# Patient Record
Sex: Female | Born: 2000 | Race: Black or African American | Hispanic: No | Marital: Single | Smoking: Never smoker
Health system: Southern US, Community
[De-identification: ages and names within clinical notes are randomized; demographics above are authoritative.]

## PROBLEM LIST (undated history)

## (undated) DIAGNOSIS — O24419 Gestational diabetes mellitus in pregnancy, unspecified control: Secondary | ICD-10-CM

---
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
## 2021-02-21 ENCOUNTER — Encounter (HOSPITAL_BASED_OUTPATIENT_CLINIC_OR_DEPARTMENT_OTHER): Payer: Self-pay | Admitting: *Deleted

## 2021-02-21 ENCOUNTER — Emergency Department (HOSPITAL_BASED_OUTPATIENT_CLINIC_OR_DEPARTMENT_OTHER)
Admission: EM | Admit: 2021-02-21 | Discharge: 2021-02-21 | Disposition: A | Discharge status: Home / Self Care | Payer: Medicaid Other | Attending: Emergency Medicine | Admitting: Emergency Medicine

## 2021-02-21 ENCOUNTER — Other Ambulatory Visit: Payer: Self-pay

## 2021-02-21 DIAGNOSIS — Z20822 Contact with and (suspected) exposure to covid-19: Secondary | ICD-10-CM | POA: Diagnosis not present

## 2021-02-21 DIAGNOSIS — R509 Fever, unspecified: Secondary | ICD-10-CM | POA: Diagnosis present

## 2021-02-21 DIAGNOSIS — B349 Viral infection, unspecified: Secondary | ICD-10-CM | POA: Diagnosis not present

## 2021-02-21 LAB — RESP PANEL BY RT-PCR (FLU A&B, COVID) ARPGX2
Influenza A by PCR: NEGATIVE
Influenza B by PCR: NEGATIVE
SARS Coronavirus 2 by RT PCR: NEGATIVE

## 2021-02-21 MED ORDER — BENZONATATE 100 MG PO CAPS: 100.0000 mg | ORAL_CAPSULE | Freq: Three times a day (TID) | ORAL | 0 refills | Status: DC | PRN

## 2021-02-21 NOTE — ED Provider Notes (Signed)
[facility] EMERGENCY DEPARTMENT Provider Note   CSN: [id number] Arrival date & time: 02/21/21  1756     History Chief Complaint  Patient presents with   Fever    Jeanette Nixon is a 20 y.o. female.  The history is provided by the patient. No language interpreter was used.  Influenza Presenting symptoms: cough, fatigue, fever (subjective), headache, rhinorrhea and sore throat   Cough:    Cough characteristics:  Dry and non-productive   Severity:  Mild   Duration:  1 day   Timing:  Sporadic   Progression:  Unchanged   Chronicity:  New Fever:    Temp source:  Subjective   Progression:  Waxing and waning Sore throat:    Severity:  Mild   Duration:  1 day   Timing:  Constant   Progression:  Unchanged Severity:  Mild Duration:  1 day Progression:  Unchanged Chronicity:  New Relieved by:  Nothing Worsened by:  Nothing Associated symptoms: chills   Risk factors: sick contacts (suspects sick contacts at work)   Risk factors: no diabetes problem       History reviewed. No pertinent past medical history.  Patient Active Problem List   Diagnosis Date Noted   Complicated laceration of lip 11/26/2019    Past Surgical History:  Procedure Laterality Date   LID LESION EXCISION Left 11/25/2019   Procedure: Complex repair of Upper Lip laceration;  Surgeon: Osborn Coho, MD;  Location: [facility] OR;  Service: ENT;  Laterality: Left;     OB History   No obstetric history on file.     No family history on file.  Social History   Tobacco Use   Smoking status: Never   Smokeless tobacco: Never  Vaping Use   Vaping Use: Never used  Substance Use Topics   Alcohol use: Never   Drug use: Never    Home Medications Prior to Admission medications   Medication Sig Start Date End Date Taking? Authorizing Provider  benzonatate (TESSALON) 100 MG capsule Take 1 capsule (100 mg total) by mouth 3 (three) times daily as needed for cough. 02/21/21  Yes Antony Madura,  PA-C  amoxicillin-clavulanate (AUGMENTIN) 875-125 MG tablet Take 1 tablet by mouth every 12 (twelve) hours. 11/25/19   Melene Plan, DO  ibuprofen (ADVIL) 600 MG tablet Take 1 tablet (600 mg total) by mouth every 6 (six) hours as needed for up to 30 doses for mild pain or moderate pain. Take with food 10/08/19   Terald Sleeper, MD    Allergies    Patient has no known allergies.  Review of Systems   Review of Systems  Constitutional:  Positive for chills, fatigue and fever (subjective).  HENT:  Positive for rhinorrhea and sore throat.   Respiratory:  Positive for cough.   Neurological:  Positive for headaches.  Ten systems reviewed and are negative for acute change, except as noted in the HPI.    Physical Exam Updated Vital Signs BP (!) 152/94 (BP Location: Right Arm)   Pulse 99   Temp 98.3 F (36.8 C) (Oral)   Resp 18   Ht 5\' 3"  (1.6 m)   Wt 91.6 kg   LMP 02/09/2021   SpO2 100%   BMI 35.78 kg/m   Physical Exam Vitals and nursing note reviewed.  Constitutional:      General: She is not in acute distress.    Appearance: She is well-developed. She is not diaphoretic.     Comments: Nontoxic appearing  HENT:     Head: Normocephalic and atraumatic.     Right Ear: External ear normal.     Left Ear: External ear normal.     Nose: No congestion.  Eyes:     General: No scleral icterus.    Extraocular Movements: Extraocular movements intact.     Conjunctiva/sclera: Conjunctivae normal.  Cardiovascular:     Rate and Rhythm: Normal rate and regular rhythm.     Pulses: Normal pulses.  Pulmonary:     Effort: Pulmonary effort is normal. No respiratory distress.     Breath sounds: No stridor. No wheezing.     Comments: Lungs CTAB. Respirations even and unlabored. Musculoskeletal:        General: Normal range of motion.     Cervical back: Normal range of motion.  Skin:    General: Skin is warm and dry.     Coloration: Skin is not pale.     Findings: No erythema or rash.   Neurological:     Mental Status: She is alert and oriented to person, place, and time.     Coordination: Coordination normal.  Psychiatric:        Behavior: Behavior normal.    ED Results / Procedures / Treatments   Labs (all labs ordered are listed, but only abnormal results are displayed) Labs Reviewed  RESP PANEL BY RT-PCR (FLU A&B, COVID) [id number]    EKG None  Radiology No results found.  Procedures Procedures   Medications Ordered in ED Medications - No data to display  ED Course  I have reviewed the triage vital signs and the nursing notes.  Pertinent labs & imaging results that were available during my care of the patient were reviewed by me and considered in my medical decision making (see chart for details).    MDM Rules/Calculators/A&P                           Patient with symptoms consistent with viral illness.  Vitals are stable, no fever.  Lungs are clear, no hypoxia. Covid/Flu swab pending.  Patient will be discharged with instructions to orally hydrate, rest, and use over-the-counter medications such as anti-inflammatories ibuprofen and Aleve for muscle aches and Tylenol for fever.  Patient will also be given a cough suppressant. Return precautions discussed and provided. Patient discharged in stable condition with no unaddressed concerns.   Final Clinical Impression(s) / ED Diagnoses Final diagnoses:  Viral illness    Rx / DC Orders ED Discharge Orders          Ordered    benzonatate (TESSALON) 100 MG capsule  3 times daily PRN        02/21/21 1856             Antony Madura, PA-C 02/21/21 1900    Vanetta Mulders, MD 02/23/21 [phone]

## 2021-02-21 NOTE — Discharge Instructions (Addendum)
Your symptoms are consistent with a viral illness. Take tylenol or ibuprofen for fever, headaches, body aches. Use Tessalon as prescribed for cough. Drink plenty of fluids to prevent dehydration. You may continue to use other over-the-counter remedies for symptom control, if desired. Return for new or concerning symptoms such as worsening shortness of breath, coughing up blood, persistent vomiting, loss of consciousness.

## 2021-02-21 NOTE — ED Triage Notes (Signed)
C/o fever , chills, sore throat , h/a x 1 day

## 2021-04-12 ENCOUNTER — Other Ambulatory Visit: Payer: Self-pay

## 2021-04-12 ENCOUNTER — Emergency Department (HOSPITAL_BASED_OUTPATIENT_CLINIC_OR_DEPARTMENT_OTHER): Payer: Medicaid Other

## 2021-04-12 ENCOUNTER — Encounter (HOSPITAL_BASED_OUTPATIENT_CLINIC_OR_DEPARTMENT_OTHER): Payer: Self-pay | Admitting: *Deleted

## 2021-04-12 ENCOUNTER — Emergency Department (HOSPITAL_BASED_OUTPATIENT_CLINIC_OR_DEPARTMENT_OTHER)
Admission: EM | Admit: 2021-04-12 | Discharge: 2021-04-12 | Disposition: A | Discharge status: Home / Self Care | Payer: Medicaid Other | Attending: Emergency Medicine | Admitting: Emergency Medicine

## 2021-04-12 DIAGNOSIS — R791 Abnormal coagulation profile: Secondary | ICD-10-CM | POA: Insufficient documentation

## 2021-04-12 DIAGNOSIS — R072 Precordial pain: Secondary | ICD-10-CM | POA: Diagnosis not present

## 2021-04-12 DIAGNOSIS — R000 Tachycardia, unspecified: Secondary | ICD-10-CM | POA: Insufficient documentation

## 2021-04-12 DIAGNOSIS — R079 Chest pain, unspecified: Secondary | ICD-10-CM

## 2021-04-12 DIAGNOSIS — R0602 Shortness of breath: Secondary | ICD-10-CM | POA: Insufficient documentation

## 2021-04-12 DIAGNOSIS — D72829 Elevated white blood cell count, unspecified: Secondary | ICD-10-CM | POA: Insufficient documentation

## 2021-04-12 LAB — CBC WITH DIFFERENTIAL/PLATELET
Abs Immature Granulocytes: 0.05 10*3/uL (ref 0.00–0.07)
Basophils Absolute: 0 10*3/uL (ref 0.0–0.1)
Basophils Relative: 0 %
Eosinophils Absolute: 0.2 10*3/uL (ref 0.0–0.5)
Eosinophils Relative: 2 %
HCT: 35 % — ABNORMAL LOW (ref 36.0–46.0)
Hemoglobin: 11.4 g/dL — ABNORMAL LOW (ref 12.0–15.0)
Immature Granulocytes: 0 %
Lymphocytes Relative: 28 %
Lymphs Abs: 3.2 10*3/uL (ref 0.7–4.0)
MCH: 23.3 pg — ABNORMAL LOW (ref 26.0–34.0)
MCHC: 32.6 g/dL (ref 30.0–36.0)
MCV: 71.6 fL — ABNORMAL LOW (ref 80.0–100.0)
Monocytes Absolute: 0.6 10*3/uL (ref 0.1–1.0)
Monocytes Relative: 5 %
Neutro Abs: 7.5 10*3/uL (ref 1.7–7.7)
Neutrophils Relative %: 65 %
Platelets: 425 10*3/uL — ABNORMAL HIGH (ref 150–400)
RBC: 4.89 MIL/uL (ref 3.87–5.11)
RDW: 17.9 % — ABNORMAL HIGH (ref 11.5–15.5)
WBC: 11.5 10*3/uL — ABNORMAL HIGH (ref 4.0–10.5)
nRBC: 0 % (ref 0.0–0.2)

## 2021-04-12 LAB — BASIC METABOLIC PANEL
Anion gap: 7 (ref 5–15)
BUN: 6 mg/dL (ref 6–20)
CO2: 25 mmol/L (ref 22–32)
Calcium: 8.9 mg/dL (ref 8.9–10.3)
Chloride: 105 mmol/L (ref 98–111)
Creatinine, Ser: 0.7 mg/dL (ref 0.44–1.00)
GFR, Estimated: >60 mL/min (ref >60)
Glucose, Bld: 153 mg/dL — ABNORMAL HIGH (ref 70–99)
Potassium: 3.7 mmol/L (ref 3.5–5.1)
Sodium: 137 mmol/L (ref 135–145)

## 2021-04-12 LAB — TROPONIN I (HIGH SENSITIVITY): Troponin I (High Sensitivity): <2 ng/L (ref <18)

## 2021-04-12 LAB — D-DIMER, QUANTITATIVE: D-Dimer, Quant: 0.56 ug/mL-FEU — ABNORMAL HIGH (ref 0.00–0.50)

## 2021-04-12 LAB — PREGNANCY, URINE: Preg Test, Ur: NEGATIVE

## 2021-04-12 MED ORDER — ALUM & MAG HYDROXIDE-SIMETH 200-200-20 MG/5ML PO SUSP
30.0000 mL | Freq: Once | ORAL | Status: AC
  Administered 2021-04-12: 30 mL via ORAL
  Filled 2021-04-12: qty 30

## 2021-04-12 MED ORDER — LIDOCAINE VISCOUS HCL 2 % MT SOLN
15.0000 mL | Freq: Once | OROMUCOSAL | Status: AC
  Administered 2021-04-12: 15 mL via ORAL
  Filled 2021-04-12: qty 15

## 2021-04-12 MED ORDER — OMEPRAZOLE 20 MG PO CPDR: 20.0000 mg | DELAYED_RELEASE_CAPSULE | Freq: Every day | ORAL | 0 refills | Status: DC

## 2021-04-12 MED ORDER — IOHEXOL 350 MG/ML SOLN
100.0000 mL | Freq: Once | INTRAVENOUS | Status: AC | PRN
  Administered 2021-04-12: 100 mL via INTRAVENOUS

## 2021-04-12 NOTE — ED Provider Notes (Signed)
I received this patient in handoff from [facility].  Please see her chart for full history.  In short patient is a 21 year old female who presented with chest pain that started after she ate a greasy meal.  She is on OCPs and recently stopped vaping.  D-dimer was ordered and was elevated.  CT PE pending at time of shift change.  Physical Exam  BP 128/77    Pulse 97    Temp 98.3 F (36.8 C) (Oral)    Resp 18    Ht 5\' 3"  (1.6 m)    Wt 91.6 kg    LMP 03/12/2021    SpO2 99%    BMI 35.77 kg/m   Physical Exam Vitals and nursing note reviewed.  Constitutional:      Appearance: Normal appearance.  HENT:     Head: Normocephalic and atraumatic.  Eyes:     General: No scleral icterus.    Conjunctiva/sclera: Conjunctivae normal.  Pulmonary:     Effort: Pulmonary effort is normal. No respiratory distress.  Skin:    Findings: No rash.  Neurological:     Mental Status: She is alert.  Psychiatric:        Mood and Affect: Mood normal.    Procedures  Procedures  ED Course / MDM    CT PE negative.  Likely GERD related.  I will start the patient on omeprazole and have her follow-up with her primary care provider.  She is agreeable to this.     03/14/2021 04/12/21 2034    2035, MD 04/15/21 [phone]

## 2021-04-12 NOTE — ED Provider Notes (Addendum)
[facility] EMERGENCY DEPARTMENT Provider Note   CSN: [id number] Arrival date & time: 04/12/21  1523     History  Chief Complaint  Patient presents with   Chest Pain    Jeanette Nixon is a 21 y.o. female presents to the ED today with complaint of gradual onset, intermittent, substernal chest pain, sharp in nature that began earlier today around 2 to 2:15 PM.  Patient endorses that she went to work early at 1:45 PM at [employer].  She ate tenders and fries prior to going on the floor.  Shortly afterwards, approximately 50 minutes later she began having substernal chest pain and mild shortness of breath.  She denies any burning sensation in her chest or throat.  She does mention that she has had these issues intermittently -she states that the most recent episode prior to today was last week.  She states that she was seen in the ED a month ago for same however her work-up was negative at that time.  Per chart review she was seen in December for viral illness.  Her COVID and flu test were negative and she was discharged home.  There was no mention of chest pain during this visit however she does have a ED visit for chest pain in February.  She had a dimer collected at that time which was elevated however CTA negative for PE.  Patient is on OCPs at this time.  She does mention that she quit vaping about 2 to 3 weeks ago as well.  She denies any history of DVT or PE.  She denies any recent prolonged travel or immobilization.  No hemoptysis.  No active malignancy.   The history is provided by the patient and medical records.      Home Medications Prior to Admission medications   Medication Sig Start Date End Date Taking? Authorizing Provider  amoxicillin-clavulanate (AUGMENTIN) 875-125 MG tablet Take 1 tablet by mouth every 12 (twelve) hours. 11/25/19   Melene Plan, DO  benzonatate (TESSALON) 100 MG capsule Take 1 capsule (100 mg total) by mouth 3 (three) times daily as needed for  cough. 02/21/21   Antony Madura, PA-C  ibuprofen (ADVIL) 600 MG tablet Take 1 tablet (600 mg total) by mouth every 6 (six) hours as needed for up to 30 doses for mild pain or moderate pain. Take with food 10/08/19   Terald Sleeper, MD      Allergies    Patient has no known allergies.    Review of Systems   Review of Systems  Constitutional:  Negative for chills, diaphoresis and fever.  Respiratory:  Positive for shortness of breath. Negative for cough.   Cardiovascular:  Positive for chest pain.  Gastrointestinal:  Negative for nausea and vomiting.  All other systems reviewed and are negative.  Physical Exam Updated Vital Signs BP 128/77    Pulse 97    Temp 98.3 F (36.8 C) (Oral)    Resp 18    Ht 5\' 3"  (1.6 m)    Wt 91.6 kg    LMP 03/12/2021    SpO2 99%    BMI 35.77 kg/m  Physical Exam Vitals and nursing note reviewed.  Constitutional:      Appearance: She is obese. She is not ill-appearing or diaphoretic.  HENT:     Head: Normocephalic and atraumatic.  Eyes:     Conjunctiva/sclera: Conjunctivae normal.  Cardiovascular:     Rate and Rhythm: Normal rate and regular rhythm.  Heart sounds: Normal heart sounds.  Pulmonary:     Effort: Pulmonary effort is normal.     Breath sounds: Normal breath sounds. No decreased breath sounds, wheezing, rhonchi or rales.  Abdominal:     General: Bowel sounds are normal.     Palpations: Abdomen is soft.     Tenderness: There is no abdominal tenderness. There is no guarding or rebound.  Musculoskeletal:     Cervical back: Neck supple.  Skin:    General: Skin is warm and dry.  Neurological:     Mental Status: She is alert.    ED Results / Procedures / Treatments   Labs (all labs ordered are listed, but only abnormal results are displayed) Labs Reviewed  BASIC METABOLIC PANEL - Abnormal; Notable for the following components:      Result Value   Glucose, Bld 153 (*)    All other components within normal limits  CBC WITH  DIFFERENTIAL/PLATELET - Abnormal; Notable for the following components:   WBC 11.5 (*)    Hemoglobin 11.4 (*)    HCT 35.0 (*)    MCV 71.6 (*)    MCH 23.3 (*)    RDW 17.9 (*)    Platelets 425 (*)    All other components within normal limits  D-DIMER, QUANTITATIVE (NOT AT [facility]) - Abnormal; Notable for the following components:   D-Dimer, Quant 0.56 (*)    All other components within normal limits  PREGNANCY, URINE  TROPONIN I (HIGH SENSITIVITY)    EKG EKG Interpretation  Date/Time:  Friday April 12 2021 15:37:46 EST Ventricular Rate:  104 PR Interval:  136 QRS Duration: 74 QT Interval:  328 QTC Calculation: 431 R Axis:   66 Text Interpretation: Sinus tachycardia Otherwise normal ECG When compared with ECG of 11-May-2020 18:03, PREVIOUS ECG IS PRESENT Confirmed by Marianna Fuss ([id number]) on 04/12/2021 6:01:28 PM  Radiology DG Chest 2 View  Result Date: 04/12/2021 CLINICAL DATA:  Chest pain after eating EXAM: CHEST - 2 VIEW COMPARISON:  05/11/2020 FINDINGS: The heart size and mediastinal contours are within normal limits. Both lungs are clear. The visualized skeletal structures are unremarkable. IMPRESSION: No active cardiopulmonary disease. Electronically Signed   By: Jasmine Pang M.D.   On: 04/12/2021 17:57    Procedures Procedures    Medications Ordered in ED Medications  iohexol (OMNIPAQUE) 350 MG/ML injection 100 mL (has no administration in time range)  alum & mag hydroxide-simeth (MAALOX/MYLANTA) 200-200-20 MG/5ML suspension 30 mL (30 mLs Oral Given 04/12/21 1801)    And  lidocaine (XYLOCAINE) 2 % viscous mouth solution 15 mL (15 mLs Oral Given 04/12/21 1801)    ED Course/ Medical Decision Making/ A&P                           Medical Decision Making 21 year old female who presents to the ED today with complaint of substernal chest pain and shortness of breath began earlier today.  Started after eating chicken tenders and fries however patient admits that she has  had these issues intermittently.  She is on OCPs and only quit vaping about 2 to 3 weeks ago.  On arrival to the ED she is tachycardic in the 110s initially.  It does appear she had a PE work-up in February of last year which was negative however given obesity, OCPs, recent vaping she is at risk for same.  Her symptoms do sound more consistent with likely acid reflux however will work-up for  PE at this time.  We will provide GI cocktail and see if this does not help patient's symptoms as well.   Amount and/or Complexity of Data Reviewed Labs: ordered.    Details: CBC with mild leukocytosis 11.500. Hgb stable at 11.4 BMP with glucose 153. No other electrolyte abnormalities Troponin < 2 UPT  negative D dimer elevated at 0.56. WIll proceed with CTA at this time to rule out PE. Radiology: ordered.    Details: CXR clear ECG/medicine tests: ordered.    Details: EKG with sinus tachycardia at 104  Risk OTC drugs. Prescription drug management.   At shift change case [facility] out [facility], [place], who will dispo patient accordingly.         Final Clinical Impression(s) / ED Diagnoses Final diagnoses:  None    Rx / DC Orders ED Discharge Orders     None         Tanda RockersVenter, Adriane Guglielmo, PA-C 04/12/21 1941    Tanda RockersVenter, Patina Spanier, PA-C 04/12/21 1942    Milagros Lollykstra, Richard S, MD 04/15/21 [id number]

## 2021-04-12 NOTE — ED Notes (Signed)
PIV #20 gauge inserted to L AC.  Patient tolerated well.

## 2021-04-12 NOTE — Discharge Instructions (Addendum)
I believe your symptoms are likely due to some acid reflux.  I have sent a medication called omeprazole to the pharmacy to help treat this.  Please take it as prescribed.  Follow-up with your primary care provider if you continue to have symptoms such as you did today.

## 2021-04-12 NOTE — ED Notes (Signed)
Attempted IV start x 2.  Unsuccessful attempts.  Primary RN made aware

## 2021-04-12 NOTE — ED Notes (Signed)
Patient discharged to home.  All discharge instructions reviewed.  Patient verbalized understanding via teachback method.  VS WDL.  Respirations even and unlabored.  Ambulatory out of ED.   °

## 2021-04-12 NOTE — ED Triage Notes (Signed)
Chest pain after eating. Hx of same with normal EKG.

## 2021-06-05 ENCOUNTER — Encounter (HOSPITAL_BASED_OUTPATIENT_CLINIC_OR_DEPARTMENT_OTHER): Payer: Self-pay | Admitting: Urology

## 2021-06-05 ENCOUNTER — Emergency Department (HOSPITAL_BASED_OUTPATIENT_CLINIC_OR_DEPARTMENT_OTHER)
Admission: EM | Admit: 2021-06-05 | Discharge: 2021-06-06 | Disposition: A | Discharge status: Home / Self Care | Payer: Medicaid Other | Attending: Emergency Medicine | Admitting: Emergency Medicine

## 2021-06-05 ENCOUNTER — Other Ambulatory Visit: Payer: Self-pay

## 2021-06-05 DIAGNOSIS — R0981 Nasal congestion: Secondary | ICD-10-CM | POA: Diagnosis not present

## 2021-06-05 DIAGNOSIS — R059 Cough, unspecified: Secondary | ICD-10-CM | POA: Diagnosis not present

## 2021-06-05 DIAGNOSIS — J3489 Other specified disorders of nose and nasal sinuses: Secondary | ICD-10-CM | POA: Diagnosis not present

## 2021-06-05 DIAGNOSIS — J029 Acute pharyngitis, unspecified: Secondary | ICD-10-CM | POA: Insufficient documentation

## 2021-06-05 DIAGNOSIS — Z20822 Contact with and (suspected) exposure to covid-19: Secondary | ICD-10-CM | POA: Diagnosis not present

## 2021-06-05 DIAGNOSIS — R509 Fever, unspecified: Secondary | ICD-10-CM | POA: Diagnosis not present

## 2021-06-05 LAB — RESP PANEL BY RT-PCR (FLU A&B, COVID) ARPGX2
Influenza A by PCR: NEGATIVE
Influenza B by PCR: NEGATIVE
SARS Coronavirus 2 by RT PCR: NEGATIVE

## 2021-06-05 LAB — GROUP A STREP BY PCR: Group A Strep by PCR: NOT DETECTED

## 2021-06-05 MED ORDER — IBUPROFEN 800 MG PO TABS
800.0000 mg | ORAL_TABLET | Freq: Once | ORAL | Status: AC
Start: 2021-06-05 — End: 2021-06-05
  Administered 2021-06-05: 800 mg via ORAL
  Filled 2021-06-05: qty 1

## 2021-06-05 NOTE — ED Triage Notes (Signed)
Sore throat, sinus congestion that started yesterday  ?States Fever of 102.4 last night  ? ? ?

## 2021-06-06 ENCOUNTER — Encounter (HOSPITAL_BASED_OUTPATIENT_CLINIC_OR_DEPARTMENT_OTHER): Payer: Self-pay | Admitting: Emergency Medicine

## 2021-06-06 NOTE — ED Provider Notes (Signed)
?[facility] EMERGENCY DEPARTMENT ?Provider Note ? ? ?CSN: [id number] ?Arrival date & time: 06/05/21  2231 ? ?  ? ?History ? ?Chief Complaint  ?Patient presents with  ? flu like symptoms   ? ? ?Jeanette Nixon is a 21 y.o. female. ? ?The history is provided by the patient.  ?URI ?Presenting symptoms: congestion, cough, fever, rhinorrhea and sore throat   ?Severity:  Moderate ?Onset quality:  Gradual ?Duration:  1 day ?Timing:  Constant ?Progression:  Unchanged ?Chronicity:  New ?Relieved by:  Nothing ?Worsened by:  Nothing ?Ineffective treatments:  None tried ?Associated symptoms: no arthralgias and no headaches   ?Risk factors: sick contacts   ?Risk factors: not elderly, no chronic cardiac disease and no recent travel   ? ?  ? ?Home Medications ?Prior to Admission medications   ?Medication Sig Start Date End Date Taking? Authorizing Provider  ?amoxicillin-clavulanate (AUGMENTIN) 875-125 MG tablet Take 1 tablet by mouth every 12 (twelve) hours. 11/25/19   Melene Plan, DO  ?benzonatate (TESSALON) 100 MG capsule Take 1 capsule (100 mg total) by mouth 3 (three) times daily as needed for cough. 02/21/21   Antony Madura, PA-C  ?ibuprofen (ADVIL) 600 MG tablet Take 1 tablet (600 mg total) by mouth every 6 (six) hours as needed for up to 30 doses for mild pain or moderate pain. Take with food 10/08/19   Terald Sleeper, MD  ?omeprazole (PRILOSEC) 20 MG capsule Take 1 capsule (20 mg total) by mouth daily. 04/12/21   Redwine, Madison A, PA-C  ?   ? ?Allergies    ?Patient has no known allergies.   ? ?Review of Systems   ?Review of Systems  ?Constitutional:  Positive for fever.  ?HENT:  Positive for congestion, rhinorrhea and sore throat.   ?Eyes:  Negative for redness.  ?Respiratory:  Positive for cough.   ?Genitourinary:  Negative for difficulty urinating.  ?Musculoskeletal:  Negative for arthralgias.  ?Neurological:  Negative for headaches.  ?Psychiatric/Behavioral:  Negative for agitation.   ?All other systems  reviewed and are negative. ? ?Physical Exam ?Updated Vital Signs ?BP (!) 148/95 (BP Location: Left Arm)   Pulse (!) 119   Temp 98.3 ?F (36.8 ?C) (Oral)   Resp 18   Ht 5\' 3"  (1.6 m)   Wt 91.6 kg   LMP 05/18/2021 (Approximate)   SpO2 100%   BMI 35.77 kg/m?  ?Physical Exam ?Vitals and nursing note reviewed.  ?Constitutional:   ?   General: She is not in acute distress. ?   Appearance: Normal appearance.  ?HENT:  ?   Head: Normocephalic and atraumatic.  ?   Nose: Rhinorrhea present.  ?   Mouth/Throat:  ?   Mouth: Mucous membranes are moist.  ?   Pharynx: Oropharynx is clear. No oropharyngeal exudate.  ?Eyes:  ?   Conjunctiva/sclera: Conjunctivae normal.  ?   Pupils: Pupils are equal, round, and reactive to light.  ?Cardiovascular:  ?   Rate and Rhythm: Normal rate and regular rhythm.  ?   Pulses: Normal pulses.  ?   Heart sounds: Normal heart sounds.  ?Pulmonary:  ?   Effort: Pulmonary effort is normal.  ?   Breath sounds: Normal breath sounds.  ?Abdominal:  ?   General: Abdomen is flat. Bowel sounds are normal.  ?   Palpations: Abdomen is soft.  ?   Tenderness: There is no abdominal tenderness. There is no guarding.  ?Musculoskeletal:     ?   General: Normal range of  motion.  ?   Cervical back: Normal range of motion and neck supple.  ?Skin: ?   General: Skin is warm and dry.  ?   Capillary Refill: Capillary refill takes less than 2 seconds.  ?Neurological:  ?   General: No focal deficit present.  ?   Mental Status: She is alert and oriented to person, place, and time.  ?   Deep Tendon Reflexes: Reflexes normal.  ?Psychiatric:     ?   Mood and Affect: Mood normal.     ?   Behavior: Behavior normal.  ? ? ?ED Results / Procedures / Treatments   ?Labs ?(all labs ordered are listed, but only abnormal results are displayed) ?Labs Reviewed  ?RESP PANEL BY RT-PCR (FLU A&B, COVID) [id number]  ?GROUP A STREP BY PCR  ? ? ?EKG ?None ? ?Radiology ?No results found. ? ?Procedures ?Procedures  ? ? ?Medications Ordered in  ED ?Medications  ?ibuprofen (ADVIL) tablet 800 mg (800 mg Oral Given 06/05/21 2329)  ? ? ?ED Course/ Medical Decision Making/ A&P ?  ?                        ?Medical Decision Making ?URI symptoms x 1 day  ? ?Amount and/or Complexity of Data Reviewed ?External Data Reviewed: notes. ?   Details: previous ED visits ?Labs: ordered. ?   Details: strep, covid and flu are all negative as reviewed by me. ? ?Risk ?OTC drugs. ?Risk Details: Patient is very well appearing with URI symptoms x 1 day.  No LAN.  No fluid in the sinuses on transillumination.  Patient is stable for discharge with close follow up.  Alternate tylenol and ibuprofen.   ? ? ?Final Clinical Impression(s) / ED Diagnoses ?Final diagnoses:  ?None  ?Return for intractable cough, coughing up blood, fevers > 100.4 unrelieved by medication, shortness of breath, intractable vomiting, chest pain, shortness of breath, weakness, numbness, changes in speech, facial asymmetry, abdominal pain, passing out, Inability to tolerate liquids or food, cough, altered mental status or any concerns. No signs of systemic illness or infection. The patient is nontoxic-appearing on exam and vital signs are within normal limits.  ?I have reviewed the triage vital signs and the nursing notes. Pertinent labs & imaging results that were available during my care of the patient were reviewed by me and considered in my medical decision making (see chart for details). After history, exam, and medical workup I feel the patient has been appropriately medically screened and is safe for discharge home. Pertinent diagnoses were discussed with the patient. Patient was given return precautions.  ? ?Rx / DC Orders ?ED Discharge Orders   ? ? None  ? ?  ? ? ?  ?Reesha Debes, MD ?06/06/21 0020 ? ?

## 2021-08-13 ENCOUNTER — Ambulatory Visit
Admission: EM | Admit: 2021-08-13 | Discharge: 2021-08-13 | Disposition: A | Discharge status: Home / Self Care | Payer: Medicaid Other | Attending: Urgent Care | Admitting: Urgent Care

## 2021-08-13 DIAGNOSIS — Z3201 Encounter for pregnancy test, result positive: Secondary | ICD-10-CM

## 2021-08-13 LAB — POCT URINE PREGNANCY: Preg Test, Ur: POSITIVE — AB

## 2021-08-13 NOTE — ED Triage Notes (Signed)
Pt states she had a positive at home pregnancy test and is wanting to be tested again here. LMP: 07/15/21

## 2021-08-13 NOTE — ED Provider Notes (Signed)
[facility] - URGENT [facility]ARE [facility]   MRN: [id number] DOB: 05/27/2000  Subjective:   Jeanette Nixon is a 21 y.o. female presenting for confirmation pregnancy test as required by her OB/GYN.  LMP was 07/15/2021.  This would not be her first pregnancy.  She had a home pregnancy test that was positive. Denies fever, n/v, abdominal pain, pelvic pain, rashes, dysuria, urinary frequency, hematuria, vaginal discharge.    No current facility-administered medications for this encounter.  Current Outpatient Medications:    amoxicillin-clavulanate (AUGMENTIN) 875-125 MG tablet, Take 1 tablet by mouth every 12 (twelve) hours., Disp: 14 tablet, Rfl: 0   benzonatate (TESSALON) 100 MG capsule, Take 1 capsule (100 mg total) by mouth 3 (three) times daily as needed for cough., Disp: 21 capsule, Rfl: 0   ibuprofen (ADVIL) 600 MG tablet, Take 1 tablet (600 mg total) by mouth every 6 (six) hours as needed for up to 30 doses for mild pain or moderate pain. Take with food, Disp: 30 tablet, Rfl: 0   omeprazole (PRILOSEC) 20 MG capsule, Take 1 capsule (20 mg total) by mouth daily., Disp: 30 capsule, Rfl: 0   No Known Allergies  History reviewed. No pertinent past medical history.   Past Surgical History:  Procedure Laterality Date   LID LESION EXCISION Left 11/25/2019   Procedure: Complex repair of Upper Lip laceration;  Surgeon: Osborn Coho, MD;  Location: [facility] OR;  Service: ENT;  Laterality: Left;    History reviewed. No pertinent family history.  Social History   Tobacco Use   Smoking status: Never   Smokeless tobacco: Never  Vaping Use   Vaping Use: Never used  Substance Use Topics   Alcohol use: Yes    Comment: occ   Drug use: Not Currently    Types: Marijuana    Comment: occ    ROS   Objective:   Vitals: BP 122/80 (BP Location: Right Arm)   Pulse (!) 110   Temp 98.8 F (37.1 C) (Oral)   Resp 18   LMP 07/15/2021   SpO2 97%   Physical Exam Constitutional:      General:  She is not in acute distress.    Appearance: Normal appearance. She is well-developed. She is not ill-appearing, toxic-appearing or diaphoretic.  HENT:     Head: Normocephalic and atraumatic.     Nose: Nose normal.     Mouth/Throat:     Mouth: Mucous membranes are moist.  Eyes:     General: No scleral icterus.       Right eye: No discharge.        Left eye: No discharge.     Extraocular Movements: Extraocular movements intact.  Cardiovascular:     Rate and Rhythm: Normal rate.  Pulmonary:     Effort: Pulmonary effort is normal.  Skin:    General: Skin is warm and dry.  Neurological:     General: No focal deficit present.     Mental Status: She is alert and oriented to person, place, and time.  Psychiatric:        Mood and Affect: Mood normal.        Behavior: Behavior normal.    Results for orders placed or performed during the hospital encounter of 08/13/21 (from the past 24 hour(s))  POCT urine pregnancy     Status: Abnormal   Collection Time: 08/13/21  1:29 PM  Result Value Ref Range   Preg Test, Ur Positive (A) Negative    Assessment and Plan :  PDMP not reviewed this encounter.  1. Positive pregnancy test    Anticipatory guidance provided.  I did give her her letter of confirmation of pregnancy test. Counseled patient on potential for adverse effects with medications prescribed/recommended today, ER and return-to-clinic precautions discussed, patient verbalized understanding.    Wallis Bamberg, PA-C 08/13/21 1410

## 2021-08-19 ENCOUNTER — Emergency Department (HOSPITAL_BASED_OUTPATIENT_CLINIC_OR_DEPARTMENT_OTHER)
Admission: EM | Admit: 2021-08-19 | Discharge: 2021-08-20 | Disposition: A | Discharge status: Home / Self Care | Payer: Medicaid Other | Attending: Emergency Medicine | Admitting: Emergency Medicine

## 2021-08-19 ENCOUNTER — Encounter (HOSPITAL_BASED_OUTPATIENT_CLINIC_OR_DEPARTMENT_OTHER): Payer: Self-pay | Admitting: Emergency Medicine

## 2021-08-19 ENCOUNTER — Other Ambulatory Visit: Payer: Self-pay

## 2021-08-19 DIAGNOSIS — Z3A01 Less than 8 weeks gestation of pregnancy: Secondary | ICD-10-CM | POA: Diagnosis not present

## 2021-08-19 DIAGNOSIS — O4690 Antepartum hemorrhage, unspecified, unspecified trimester: Secondary | ICD-10-CM

## 2021-08-19 DIAGNOSIS — N9489 Other specified conditions associated with female genital organs and menstrual cycle: Secondary | ICD-10-CM | POA: Diagnosis not present

## 2021-08-19 DIAGNOSIS — O209 Hemorrhage in early pregnancy, unspecified: Secondary | ICD-10-CM | POA: Insufficient documentation

## 2021-08-19 NOTE — ED Provider Notes (Signed)
Emergency Department Provider Note   I have reviewed the triage vital signs and the nursing notes.   HISTORY  Chief Complaint Vaginal Bleeding   HPI Jeanette Nixon is a 21 y.o. female G2P1 currently approx [redacted] weeks pregnant presents to the ED with cramping abdominal pain and vaginal bleeding. Patient developed vaginal bleeding at 7 AM which stopped but then returned 1 hour PTA. No passage of clots or tissue. No abdominal or back pain. No weakness, SOB, or lightheadedness.   History reviewed. No pertinent past medical history.  Review of Systems Constitutional: No fever/chills Eyes: No visual changes. ENT: No sore throat. Cardiovascular: Denies chest pain. Respiratory: Denies shortness of breath. Gastrointestinal: No abdominal pain.  No nausea, no vomiting.  No diarrhea.  No constipation. Genitourinary: Negative for dysuria. Positive vaginal bleeding.  Musculoskeletal: Negative for back pain. Skin: Negative for rash. Neurological: Negative for headaches, focal weakness or numbness.   ____________________________________________   PHYSICAL EXAM:  VITAL SIGNS: ED Triage Vitals  Enc Vitals Group     BP 08/19/21 2215 (!) 143/92     Pulse Rate 08/19/21 2215 (!) 105     Resp 08/19/21 2215 16     Temp 08/19/21 2215 98.3 F (36.8 C)     Temp src --      SpO2 08/19/21 2215 99 %     Weight 08/19/21 2214 198 lb (89.8 kg)     Height 08/19/21 2214 5\' 4"  (1.626 m)   Constitutional: Alert and oriented. Well appearing and in no acute distress. Eyes: Conjunctivae are normal. Head: Atraumatic. Nose: No congestion/rhinnorhea. Mouth/Throat: Mucous membranes are moist.   Neck: No stridor.   Cardiovascular: Normal rate, regular rhythm. Good peripheral circulation. Grossly normal heart sounds.   Respiratory: Normal respiratory effort.  No retractions. Lungs CTAB. Gastrointestinal: Soft and nontender. No distention.  Musculoskeletal: No gross deformities of  extremities. Neurologic:  Normal speech and language.  Skin:  Skin is warm, dry and intact. No rash noted.   ____________________________________________   LABS (all labs ordered are listed, but only abnormal results are displayed)  Labs Reviewed  COMPREHENSIVE METABOLIC PANEL - Abnormal; Notable for the following components:      Result Value   Albumin 3.4 (*)    All other components within normal limits  CBC WITH DIFFERENTIAL/PLATELET - Abnormal; Notable for the following components:   WBC 13.8 (*)    Hemoglobin 10.3 (*)    HCT 31.0 (*)    MCV 72.4 (*)    MCH 24.1 (*)    RDW 18.9 (*)    Platelets 447 (*)    Neutro Abs 9.5 (*)    All other components within normal limits  HCG, QUANTITATIVE, PREGNANCY - Abnormal; Notable for the following components:   hCG, Beta Chain, Quant, S 7,428 (*)    All other components within normal limits  WET PREP, GENITAL  URINALYSIS, ROUTINE W REFLEX MICROSCOPIC  ABO/RH  GC/CHLAMYDIA PROBE AMP ([place]) NOT AT [facility]   ____________________________________________  RADIOLOGY  [facility]TO [facility] OB LESS THAN 14 WEEKS WITH OB TRANSVAGINAL  Result Date: 08/20/2021 CLINICAL DATA:  Pregnant, bleeding EXAM: OBSTETRIC <14 WK 08/22/2021 AND TRANSVAGINAL OB US TECHNIQUE: Both transabdominal and transvaginal ultrasound examinations were performed for complete evaluation of the gestation as well as the maternal uterus, adnexal regions, and pelvic cul-de-sac. Transvaginal technique was performed to assess early pregnancy. COMPARISON:  None Available. FINDINGS: Intrauterine gestational sac: Single Yolk sac:  Visualized. Embryo:  Visualized. Cardiac Activity: Not Visualized. CRL:  2 mm   5 w   5 d                  [place] EDC: 04/17/2012 Subchorionic hemorrhage:  None visualized. Maternal uterus/adnexae: Bilateral ovaries are within normal limits, noting right corpus luteum. Trace free fluid. IMPRESSION: Single intrauterine gestation, measuring 5 weeks 5 days by crown-rump length. No  cardiac activity, possibly due to the small size of the early gestation. Consider follow-up pelvic ultrasound in 7-10 days to confirm viability, as clinically warranted. Electronically Signed   By: Charline Bills M.D.   On: 08/20/2021 01:54    ____________________________________________   PROCEDURES  Procedure(s) performed:   Procedures  None  ____________________________________________   INITIAL IMPRESSION / ASSESSMENT AND PLAN / ED COURSE  Pertinent labs & imaging results that were available during my care of the patient were reviewed by me and considered in my medical decision making (see chart for details).   This patient is Presenting for Evaluation of vaginal bleeding in pregnancy, which does require a range of treatment options, and is a complaint that involves a high risk of morbidity and mortality.  The Differential Diagnoses include placental abruption, miscarriage, UTI, vaginitis.    I decided to review pertinent External Data, and in summary No ABO RH status in chart review.   Clinical Laboratory Tests Ordered, included quant hCG is > 7000. UA without evidence of infection. No AKI. No severe anemia. Rh Positive.   Radiologic Tests Ordered, included pelvic US. I independently interpreted the images and agree with radiology interpretation.   Cardiac Monitor Tracing which shows NSR.   Social Determinants of Health Risk patient is a non-smoker.    Medical Decision Making: Summary:  Patient presents to the emergency department with bleeding in early pregnancy.  She is approximately [redacted] weeks pregnant with bleeding today.  Nontender abdomen.  Reevaluation with update and discussion with patient. Discussed [place] results and OB follow up plan for repeat pelvic US. Discussed strict ED return precautions.   Disposition: discharge  ____________________________________________  FINAL CLINICAL IMPRESSION(S) / ED DIAGNOSES  Final diagnoses:  Vaginal bleeding in  pregnancy    Note:  This document was prepared using Dragon voice recognition software and may include unintentional dictation errors.  Alona Bene, MD, [facility] Emergency Medicine    Bret Stamour, Arlyss Repress, MD 08/20/21 [phone]

## 2021-08-19 NOTE — ED Triage Notes (Signed)
Patient reports she recently found out she was pregnant and is approx. 5 weeks and 3 days. Patient c/o vaginal bleeding since this morning around 7 am, stopped, and started back about 1 hour pta and abdominal cramping.

## 2021-08-20 ENCOUNTER — Emergency Department (HOSPITAL_BASED_OUTPATIENT_CLINIC_OR_DEPARTMENT_OTHER): Payer: Medicaid Other

## 2021-08-20 LAB — COMPREHENSIVE METABOLIC PANEL
ALT: 16 U/L (ref 0–44)
AST: 18 U/L (ref 15–41)
Albumin: 3.4 g/dL — ABNORMAL LOW (ref 3.5–5.0)
Alkaline Phosphatase: 60 U/L (ref 38–126)
Anion gap: 7 (ref 5–15)
BUN: 10 mg/dL (ref 6–20)
CO2: 24 mmol/L (ref 22–32)
Calcium: 9.3 mg/dL (ref 8.9–10.3)
Chloride: 106 mmol/L (ref 98–111)
Creatinine, Ser: 0.65 mg/dL (ref 0.44–1.00)
GFR, Estimated: >60 mL/min (ref >60)
Glucose, Bld: 97 mg/dL (ref 70–99)
Potassium: 3.5 mmol/L (ref 3.5–5.1)
Sodium: 137 mmol/L (ref 135–145)
Total Bilirubin: 0.4 mg/dL (ref 0.3–1.2)
Total Protein: 7.7 g/dL (ref 6.5–8.1)

## 2021-08-20 LAB — CBC WITH DIFFERENTIAL/PLATELET
Abs Immature Granulocytes: 0.06 10*3/uL (ref 0.00–0.07)
Basophils Absolute: 0 10*3/uL (ref 0.0–0.1)
Basophils Relative: 0 %
Eosinophils Absolute: 0.1 10*3/uL (ref 0.0–0.5)
Eosinophils Relative: 1 %
HCT: 31 % — ABNORMAL LOW (ref 36.0–46.0)
Hemoglobin: 10.3 g/dL — ABNORMAL LOW (ref 12.0–15.0)
Immature Granulocytes: 0 %
Lymphocytes Relative: 24 %
Lymphs Abs: 3.3 10*3/uL (ref 0.7–4.0)
MCH: 24.1 pg — ABNORMAL LOW (ref 26.0–34.0)
MCHC: 33.2 g/dL (ref 30.0–36.0)
MCV: 72.4 fL — ABNORMAL LOW (ref 80.0–100.0)
Monocytes Absolute: 0.7 10*3/uL (ref 0.1–1.0)
Monocytes Relative: 5 %
Neutro Abs: 9.5 10*3/uL — ABNORMAL HIGH (ref 1.7–7.7)
Neutrophils Relative %: 70 %
Platelets: 447 10*3/uL — ABNORMAL HIGH (ref 150–400)
RBC: 4.28 MIL/uL (ref 3.87–5.11)
RDW: 18.9 % — ABNORMAL HIGH (ref 11.5–15.5)
WBC: 13.8 10*3/uL — ABNORMAL HIGH (ref 4.0–10.5)
nRBC: 0 % (ref 0.0–0.2)

## 2021-08-20 LAB — HCG, QUANTITATIVE, PREGNANCY: hCG, Beta Chain, Quant, S: 7428 m[IU]/mL — ABNORMAL HIGH (ref <5)

## 2021-08-20 LAB — URINALYSIS, ROUTINE W REFLEX MICROSCOPIC
Bilirubin Urine: NEGATIVE
Glucose, UA: NEGATIVE mg/dL
Hgb urine dipstick: NEGATIVE
Ketones, ur: NEGATIVE mg/dL
Leukocytes,Ua: NEGATIVE
Nitrite: NEGATIVE
Protein, ur: NEGATIVE mg/dL
Specific Gravity, Urine: 1.025 (ref 1.005–1.030)
pH: 6 (ref 5.0–8.0)

## 2021-08-20 LAB — ABO/RH: ABO/RH(D): AB POS

## 2021-08-20 NOTE — Discharge Instructions (Signed)
You were seen in the emergency room today with bleeding during pregnancy.  Your ultrasound today shows an intrauterine pregnancy at approximately 5 weeks / 5 days along.  This pregnancy is still very early.  The radiologist is recommending a repeat ultrasound in 7 to 10 days.  Please follow with your OB/GYN to schedule this.  If you develop new or suddenly worsening symptoms such as worsening abdominal pain or bleeding you should return to the emergency department for reevaluation.

## 2021-08-28 ENCOUNTER — Ambulatory Visit: Payer: Medicaid Other | Admitting: Nurse Practitioner

## 2022-03-05 ENCOUNTER — Emergency Department (HOSPITAL_BASED_OUTPATIENT_CLINIC_OR_DEPARTMENT_OTHER)
Admission: EM | Admit: 2022-03-05 | Discharge: 2022-03-06 | Disposition: A | Discharge status: Home / Self Care | Payer: Medicaid Other | Attending: Emergency Medicine | Admitting: Emergency Medicine

## 2022-03-05 ENCOUNTER — Other Ambulatory Visit: Payer: Self-pay

## 2022-03-05 DIAGNOSIS — Z3A34 34 weeks gestation of pregnancy: Secondary | ICD-10-CM | POA: Insufficient documentation

## 2022-03-05 DIAGNOSIS — O368130 Decreased fetal movements, third trimester, not applicable or unspecified: Secondary | ICD-10-CM | POA: Diagnosis present

## 2022-03-05 HISTORY — DX: Gestational diabetes mellitus in pregnancy, unspecified control: O24.419

## 2022-03-05 NOTE — ED Triage Notes (Signed)
Pt POV -G1P00  Reports "less" fetal movement than normal.  Last felt baby move appx 2000 today.   Denies pain. Denies leaking/loss fluid. Denies falls or injuries.   [facility].  EDD 04/17/2022

## 2022-03-05 NOTE — ED Triage Notes (Incomplete)
Pt POV -  Reports "less" fetal movement than normal.  Last felt baby move appx 2000 today. Reports [redacted]wk pregnant.   Denies pain. Denies leaking/loss fluid. Denies falls or injuries.   [facility] [address] at [facility].  EDD

## 2022-03-06 ENCOUNTER — Other Ambulatory Visit: Payer: Self-pay

## 2022-03-06 ENCOUNTER — Encounter (HOSPITAL_BASED_OUTPATIENT_CLINIC_OR_DEPARTMENT_OTHER): Payer: Self-pay | Admitting: Emergency Medicine

## 2022-03-06 NOTE — Progress Notes (Signed)
Spoke with Lyla Son at [facility] and advised of Dr Alyssa Grove recommendation to discharge patient home.

## 2022-03-06 NOTE — ED Provider Notes (Signed)
[facility] EMERGENCY DEPARTMENT Provider Note   CSN: [id number] Arrival date & time: 03/05/22  2351     History  Chief Complaint  Patient presents with   Decreased Fetal Movement    Jeanette Nixon is a 21 y.o. female.  The history is provided by the patient.  Patient is a G1, P0 at approximately 34 weeks who presents with decreased fetal movement.  Patient reports that she has had decreased fetal movement since 8 PM.  No fevers or vomiting.  No abdominal pain.  No vaginal bleeding or loss of fluid.  No recent falls or traumas.  She has otherwise been doing well.  She does report gestational diabetes but is well-controlled     Home Medications Prior to Admission medications   Not on File      Allergies    Patient has no known allergies.    Review of Systems   Review of Systems  Constitutional:  Negative for fever.  Gastrointestinal:  Negative for vomiting.  Genitourinary:  Negative for vaginal bleeding and vaginal discharge.    Physical Exam Updated Vital Signs BP 132/84   Pulse (!) 102   Temp 98.6 F (37 C) (Oral)   Resp 19   Ht 1.626 m (5\' 4" )   Wt 101.6 kg   LMP 07/15/2021 (Exact Date)   SpO2 97%   BMI 38.45 kg/m  Physical Exam CONSTITUTIONAL: Well developed/well nourished HEAD: Normocephalic/atraumatic EYES: EOMI ENMT: Mucous membranes moist NECK: supple no meningeal signs CV: S1/S2 noted, no murmurs/rubs/gallops noted LUNGS: Lungs are clear to auscultation bilaterally, no apparent distress ABDOMEN: soft, gravid, nontender NEURO: Pt is awake/alert/appropriate, moves all extremitiesx4.  No facial droop.  Patient standing and talking on the phone SKIN: warm, color normal PSYCH: Anxious  ED Results / Procedures / Treatments   Labs (all labs ordered are listed, but only abnormal results are displayed) Labs Reviewed - No data to display  EKG None  Radiology No results found.  Procedures Procedures    Medications Ordered in  ED Medications - No data to display  ED Course/ Medical Decision Making/ A&P Clinical Course as of 03/06/22 0138  Thu Mar 06, 2022  0004 Patient is approximately 34 weeks presents with decreased fetal movement.  Patient will be placed on fetal monitoring and sent to our OB/GYN colleagues.  Will follow closely [DW]  0009 Initial FHT in 120's, tracing will be sent to [facility] [DW]  0040 Fetal heart rate continues to be appropriate.  She is still being monitored.  Patient does have elevated blood pressure, reports she has had this recently.  Denies any new headache.  No new vision changes.  No obvious lower extremity edema.  No clonus.  Will continue to monitor [DW]  0106 Per OB, fetal heart tones and tracing appears appropriate.  Will continue monitor blood pressure [DW]  0137 Patient is improved.  Her blood pressure is improved.  Per OB, patient is safe for discharge home.  Patient has follow-up with her own OB/GYN today at 61 AM.  We discussed strict return precautions [DW]    Clinical Course User Index [DW] 4, MD                           Medical Decision Making          Final Clinical Impression(s) / ED Diagnoses Final diagnoses:  [redacted] weeks gestation of pregnancy    Rx / DC Orders ED  Discharge Orders     None         Zadie Rhine, MD 03/06/22 [phone]

## 2022-03-06 NOTE — ED Notes (Signed)
Per [facility] RN Meriam Sprague, pt has good fetal movement.  RN to repeat pts BP at 0100 and notify [facility] RN if BP continues to be elevated. EDP Wickline made aware.

## 2022-03-06 NOTE — Progress Notes (Signed)
Dr Despina Hidden notified of patient EFM tracing and vital signs.  Advised that patient has history of GHTN.  Per Dr Despina Hidden patient may be discharged home.

## 2022-03-06 NOTE — ED Notes (Signed)
Per Meriam Sprague [facility] RN, pt is cleared from [facility] service and is stable for d/c. EDP Wickline made aware.

## 2022-03-06 NOTE — ED Notes (Signed)
Pt placed on toco monitor on arrival to room. Fetal heart tones acquired- HR noted to be 147.

## 2022-03-06 NOTE — ED Notes (Signed)
[facility] RN notified of pt status.

## 2022-03-06 NOTE — Discharge Instructions (Signed)
Please follow-up with your OB/GYN physician today.  If you have any new headache, new weakness, abdominal pain or any bleeding or loss of fluid please return to the ER

## 2022-03-06 NOTE — Progress Notes (Signed)
Received call from [facility] at [facility] regarding 34 week patient with complaint of decreased fetal movement.  No bleeding, no complaint of pain or contractions and no bleeding.  Pt assingned in OBIX and am able to view EFM tracing.

## 2023-05-21 ENCOUNTER — Other Ambulatory Visit: Payer: Self-pay

## 2023-05-21 ENCOUNTER — Emergency Department (HOSPITAL_BASED_OUTPATIENT_CLINIC_OR_DEPARTMENT_OTHER): Payer: Medicaid Other

## 2023-05-21 ENCOUNTER — Emergency Department (HOSPITAL_BASED_OUTPATIENT_CLINIC_OR_DEPARTMENT_OTHER)
Admission: EM | Admit: 2023-05-21 | Discharge: 2023-05-21 | Disposition: A | Discharge status: Home / Self Care | Payer: Medicaid Other | Attending: Emergency Medicine | Admitting: Emergency Medicine

## 2023-05-21 ENCOUNTER — Encounter (HOSPITAL_BASED_OUTPATIENT_CLINIC_OR_DEPARTMENT_OTHER): Payer: Self-pay | Admitting: Emergency Medicine

## 2023-05-21 DIAGNOSIS — W228XXA Striking against or struck by other objects, initial encounter: Secondary | ICD-10-CM | POA: Diagnosis not present

## 2023-05-21 DIAGNOSIS — S8011XA Contusion of right lower leg, initial encounter: Secondary | ICD-10-CM | POA: Diagnosis not present

## 2023-05-21 DIAGNOSIS — S8991XA Unspecified injury of right lower leg, initial encounter: Secondary | ICD-10-CM | POA: Diagnosis present

## 2023-05-21 NOTE — ED Provider Notes (Signed)
  [place] EMERGENCY DEPARTMENT AT [facility] Provider Note   CSN: [id number] Arrival date & time: 05/21/23  0225     History  Chief Complaint  Patient presents with   Fall   Leg Injury    Jeanette Nixon is a 23 y.o. female.  Patient is a 23 year old female with no significant past medical history.  She presents with complaints of right leg injury.  She was getting into the shower when she slipped and struck her lower leg on the faucet.  She reports pain when she ambulates and bears weight.  No alleviating factors.  The history is provided by the patient.       Home Medications Prior to Admission medications   Not on File      Allergies    Patient has no known allergies.    Review of Systems   Review of Systems  All other systems reviewed and are negative.   Physical Exam Updated Vital Signs BP (!) 147/109 (BP Location: Right Arm)   Pulse (!) 108   Temp 99.1 F (37.3 C)   Resp 18   Ht 5\' 4"  (1.626 m)   Wt 93 kg   LMP  (LMP Unknown)   SpO2 98%   BMI 35.19 kg/m  Physical Exam Vitals and nursing note reviewed.  Constitutional:      Appearance: Normal appearance.  Pulmonary:     Effort: Pulmonary effort is normal.  Musculoskeletal:     Comments: There is mild swelling and tenderness over the anterior aspect of the right lower leg.  There is no deformity.  DP pulses are palpable and motor and sensation are intact throughout the entire foot.  Skin:    General: Skin is warm and dry.  Neurological:     Mental Status: She is alert and oriented to person, place, and time.     ED Results / Procedures / Treatments   Labs (all labs ordered are listed, but only abnormal results are displayed) Labs Reviewed - No data to display  EKG None  Radiology No results found.  Procedures Procedures    Medications Ordered in ED Medications - No data to display  ED Course/ Medical Decision Making/ A&P  Patient presenting with a right leg  injury as described in the HPI.  Her x-rays are negative.  This to be treated as a contusion.  The follow-up as needed.  Final Clinical Impression(s) / ED Diagnoses Final diagnoses:  None    Rx / DC Orders ED Discharge Orders     None         Geoffery Lyons, MD 05/21/23 [phone]

## 2023-05-21 NOTE — ED Triage Notes (Addendum)
 Pt getting into shower, slipped and hit right leg on faucet.

## 2023-05-21 NOTE — Discharge Instructions (Signed)
 Your x-ray showed no evidence for fracture.  Ice for 20 minutes every 2 hours while awake for the next 2 days.  Elevate your leg is much as possible for the next 2 days.  Take ibuprofen 600 mg every 6 hours as needed for pain.

## 2023-07-08 IMAGING — CT CT ANGIO CHEST
2 of 8 series · 19 of 36 positions shown · IV contrast (Omnipaque)
Comparison: None.

CLINICAL DATA: Chest pain, positive D-dimer

EXAM:
CT ANGIOGRAPHY CHEST WITH CONTRAST
TECHNIQUE: Multidetector CT imaging of the chest was performed using the
standard protocol during bolus administration of intravenous
contrast. Multiplanar CT image reconstructions and MIPs were
obtained to evaluate the vascular anatomy.

[Series 6: pe coronal mpr · coronal · 0.54mm/px · 1 of 158 slices shown]
[im 79/158  mediastinal]
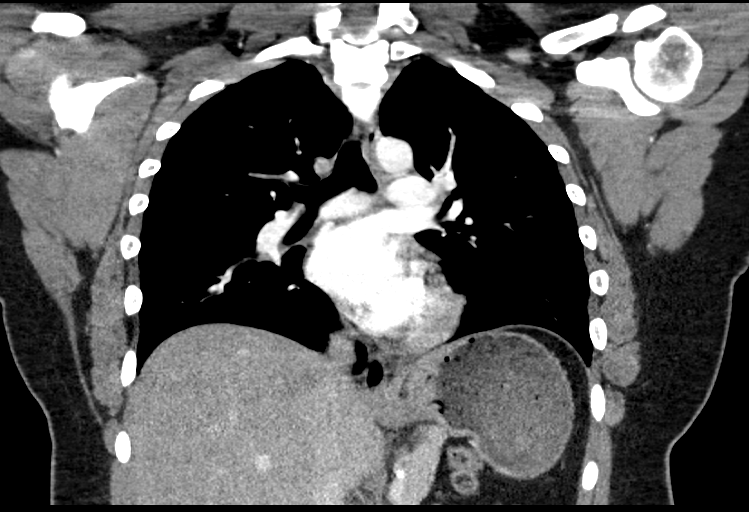

[Series 10: pe thins · axial · 0.98mm/px · z∈[-238,+3]mm · 18 of 271 slices shown]
[im 15/271  lung]
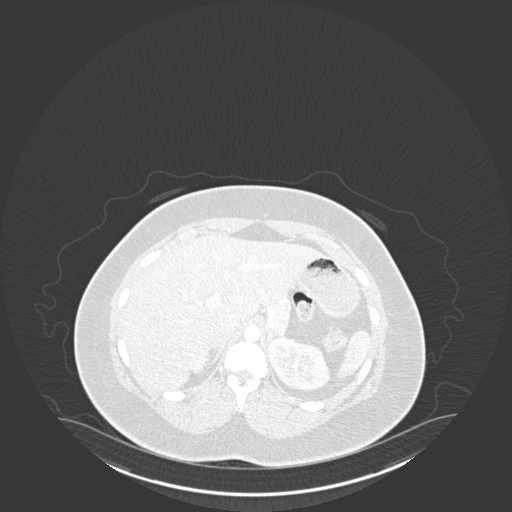
[im 29/271  mediastinal]
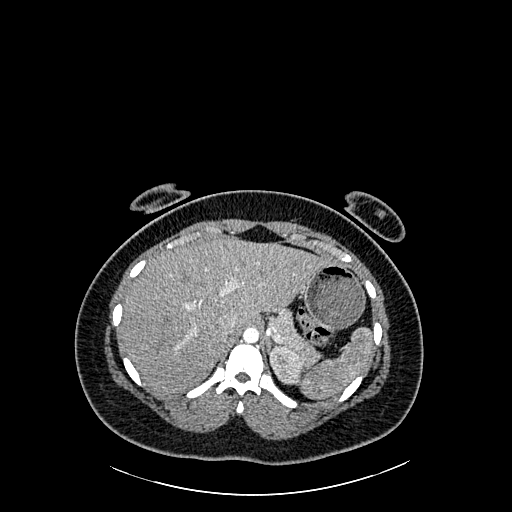
[im 43/271  lung]
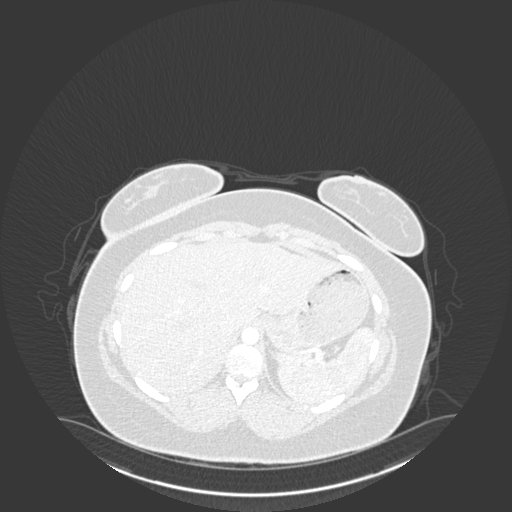
[im 57/271  mediastinal]
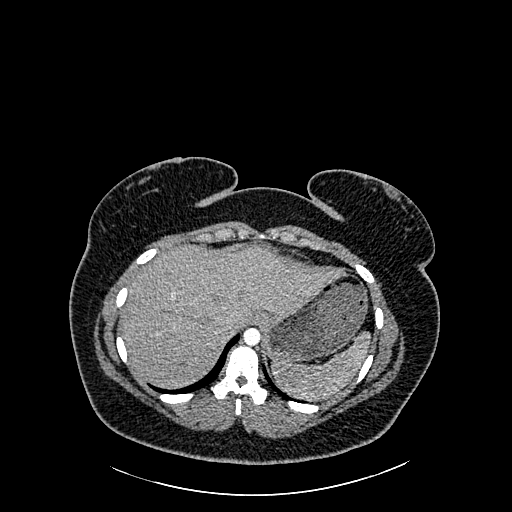
[im 72/271  lung]
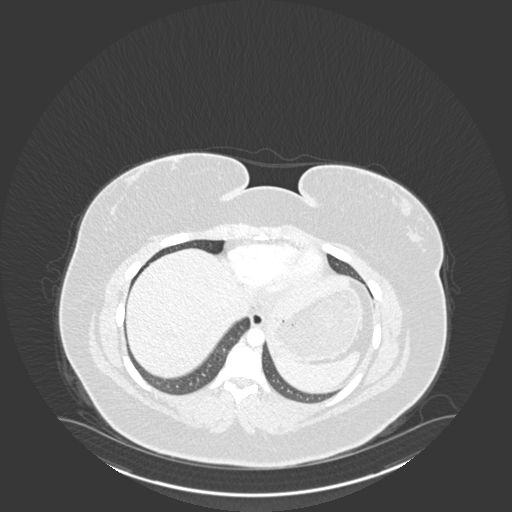
[im 86/271  mediastinal]
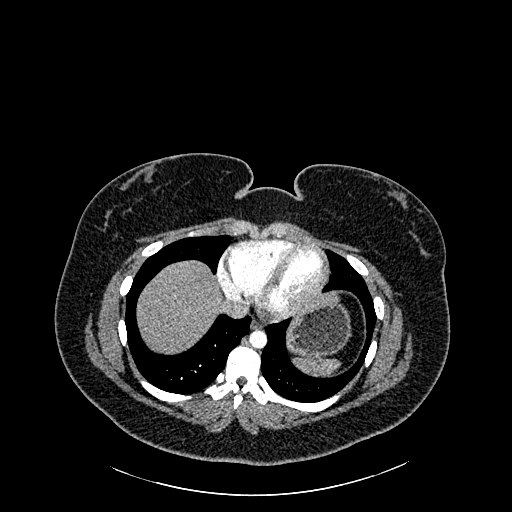
[im 100/271  lung]
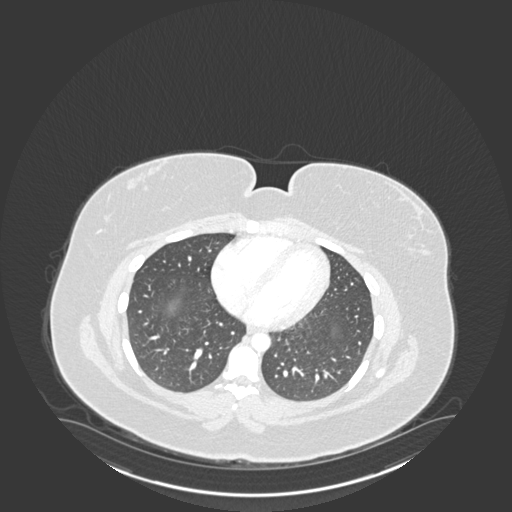
[im 114/271  mediastinal]
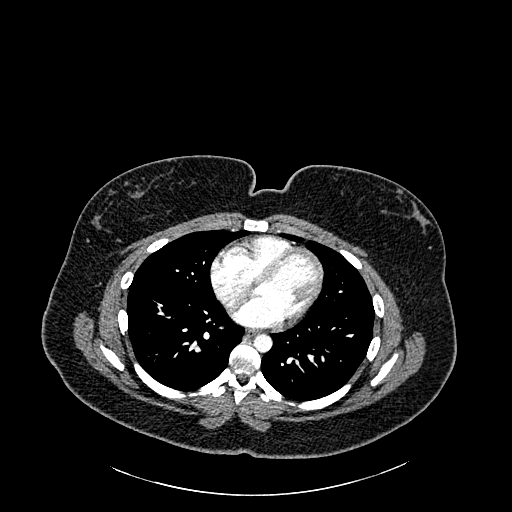
[im 128/271  lung]
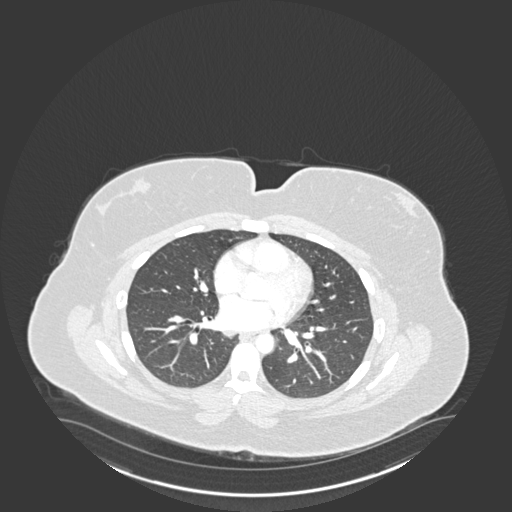
[im 143/271  mediastinal]
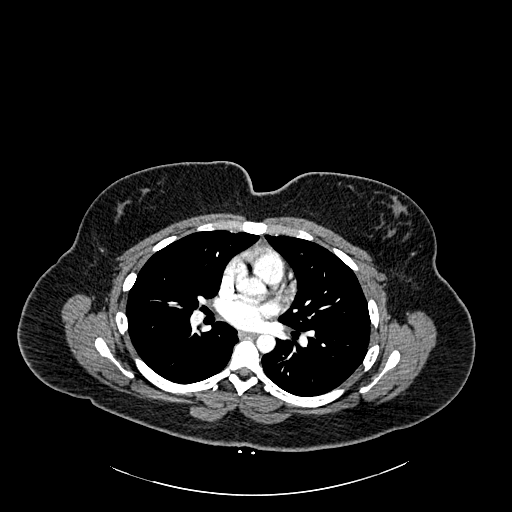
[im 157/271  lung]
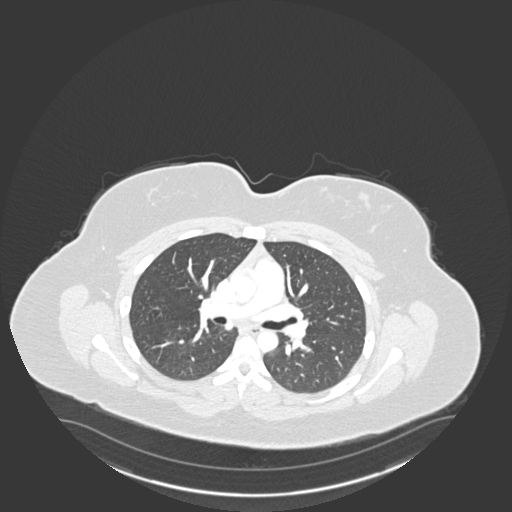
[im 171/271  mediastinal]
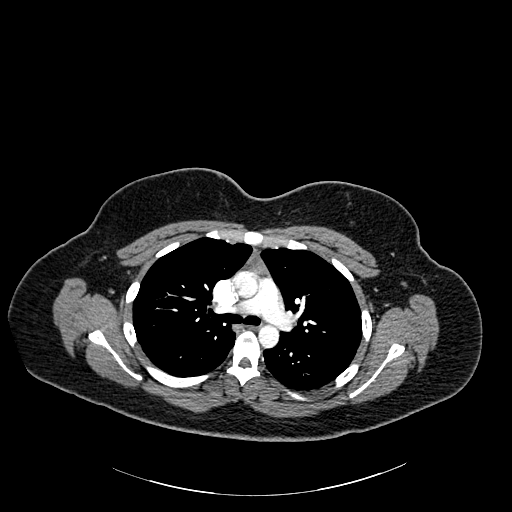
[im 185/271  lung]
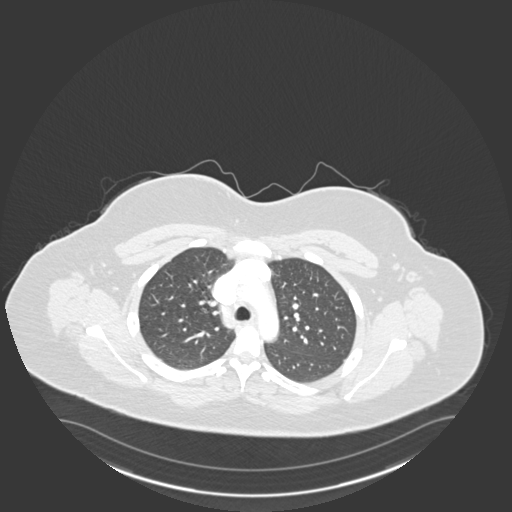
[im 199/271  mediastinal]
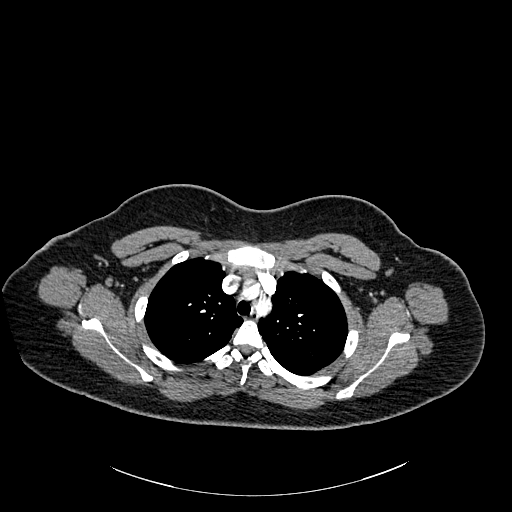
[im 214/271  lung]
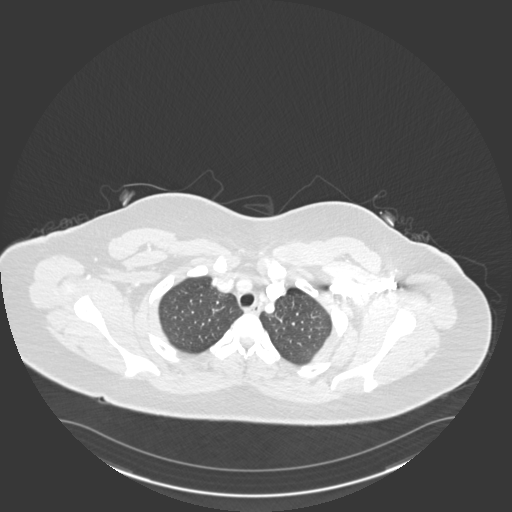
[im 228/271  mediastinal]
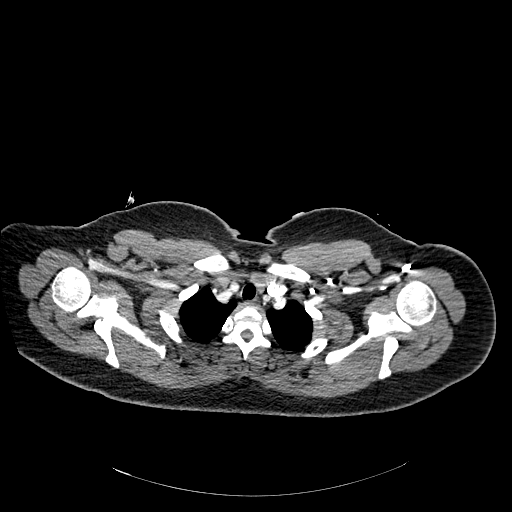
[im 242/271  lung]
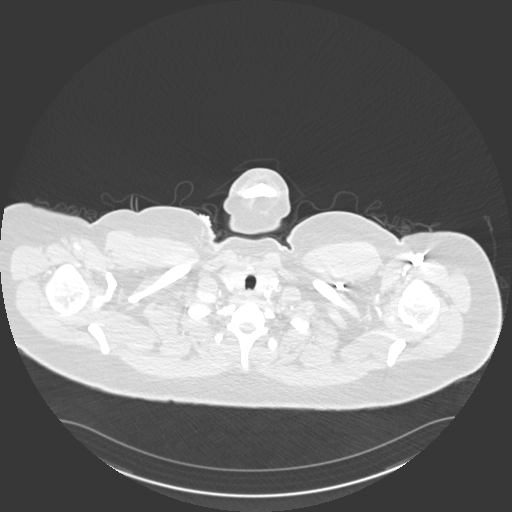
[im 256/271  mediastinal]
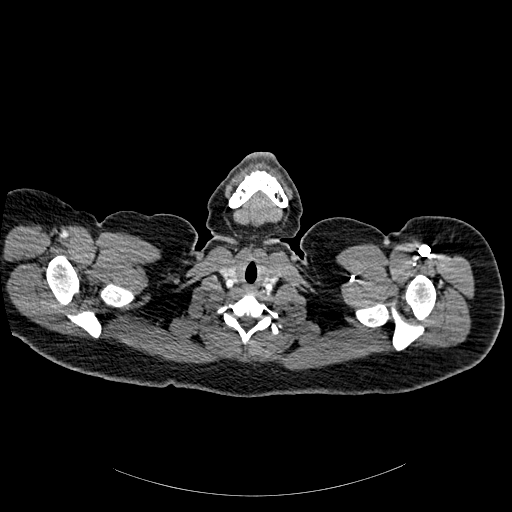

[19 of 36 positions shown; findings below may reference images not displayed]

RADIATION DOSE REDUCTION: This exam was performed according to the
departmental dose-optimization program which includes automated
exposure control, adjustment of the mA and/or kV according to
patient size and/or use of iterative reconstruction technique.

CONTRAST:  100mL OMNIPAQUE IOHEXOL 350 MG/ML SOLN
FINDINGS: Cardiovascular: No filling defects in the pulmonary arteries to
suggest pulmonary emboli. Heart is normal size. Aorta is normal
caliber.

Mediastinum/Nodes: No mediastinal, hilar, or axillary adenopathy.
Trachea and esophagus are unremarkable. Thyroid unremarkable.

Lungs/Pleura: Lungs are clear. No focal airspace opacities or
suspicious nodules. No effusions.

Upper Abdomen: Imaging into the upper abdomen demonstrates no acute
findings.

Musculoskeletal: Chest wall soft tissues are unremarkable. No acute
bony abnormality.

Review of the MIP images confirms the above findings.
IMPRESSION: No acute cardiopulmonary disease.

No evidence of pulmonary embolus.

## 2023-11-15 IMAGING — US US OB < 14 WEEKS - US OB TV
1 series · 14 of 28 positions shown · non-contrast
Comparison: None Available.

CLINICAL DATA: Pregnant, bleeding

EXAM:
OBSTETRIC <14 WK US AND TRANSVAGINAL OB US
TECHNIQUE: Both transabdominal and transvaginal ultrasound examinations were
performed for complete evaluation of the gestation as well as the
maternal uterus, adnexal regions, and pelvic cul-de-sac.
Transvaginal technique was performed to assess early pregnancy.

[Series 1: us ob < 14 weeks - us ob tv · 14 of 77 slices shown]
[im 3/77]
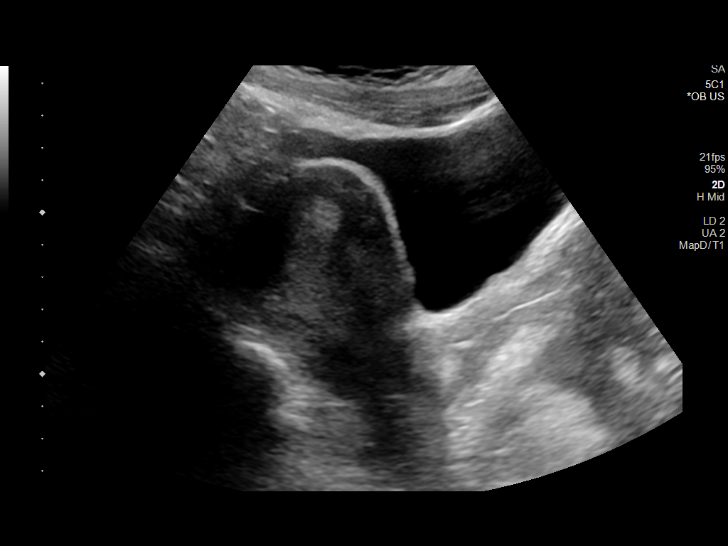
[im 9/77]
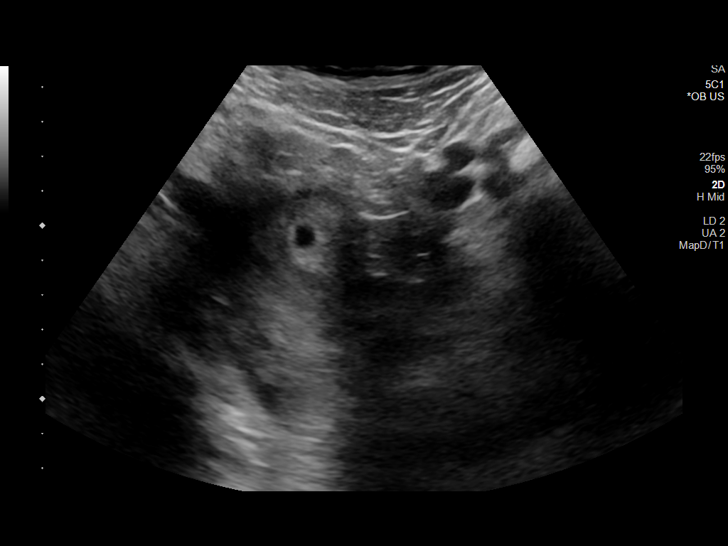
[im 15/77]
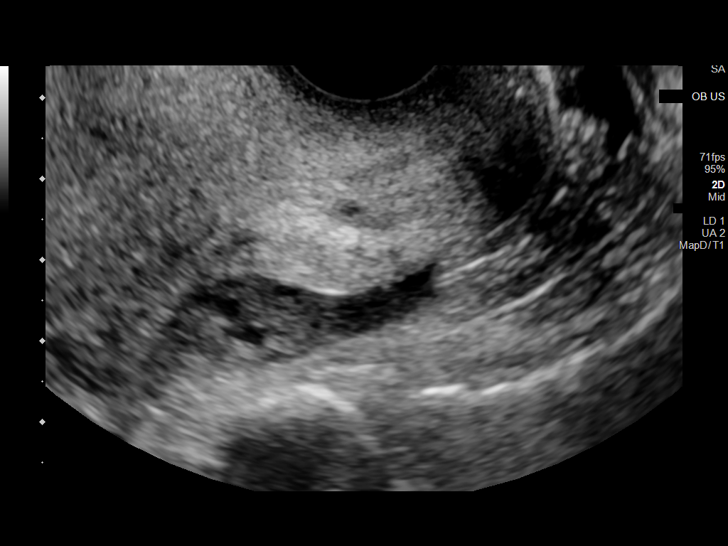
[im 20/77]
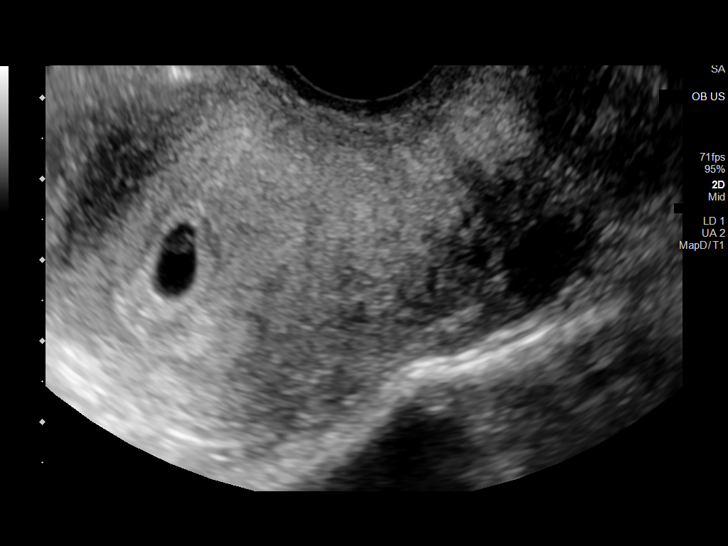
[im 26/77]
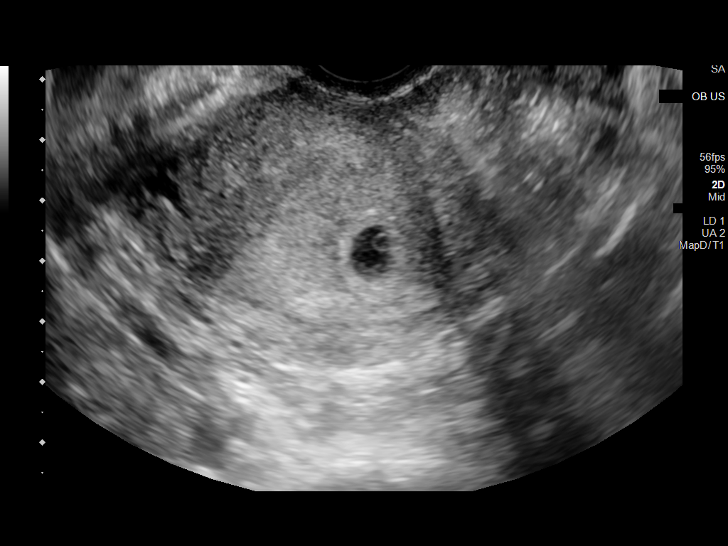
[im 31/77]
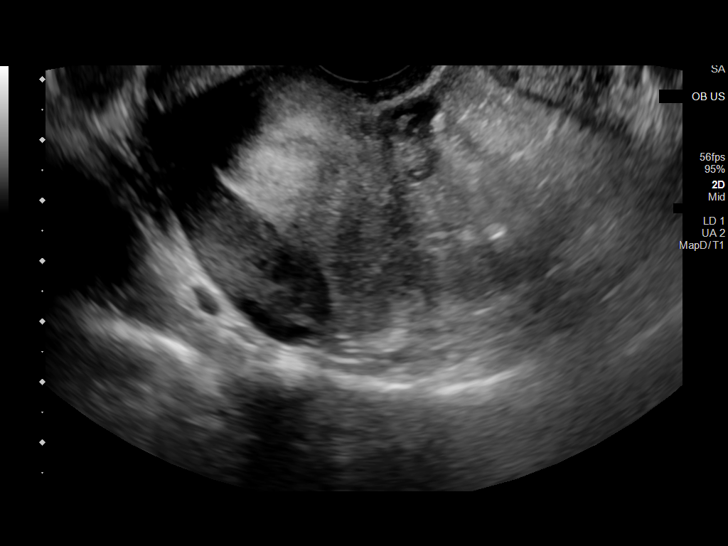
[im 37/77]
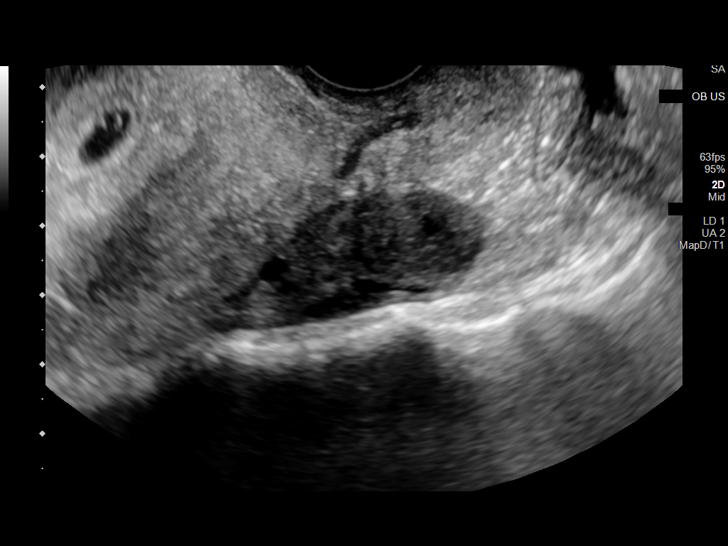
[im 43/77]
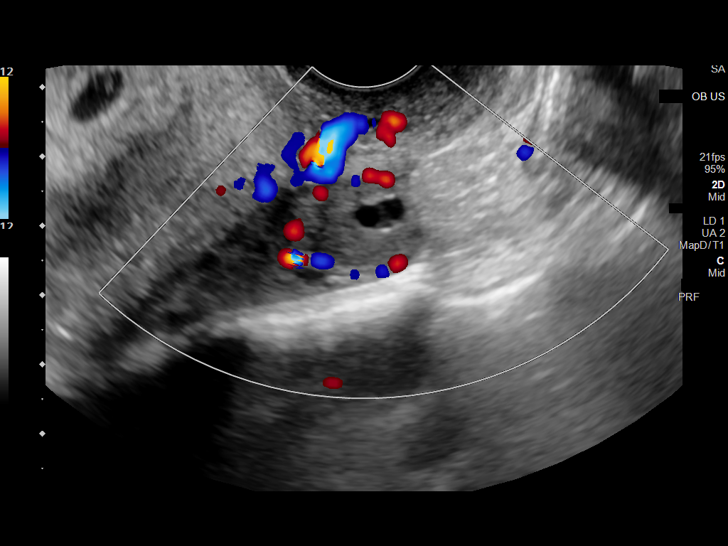
[im 48/77]
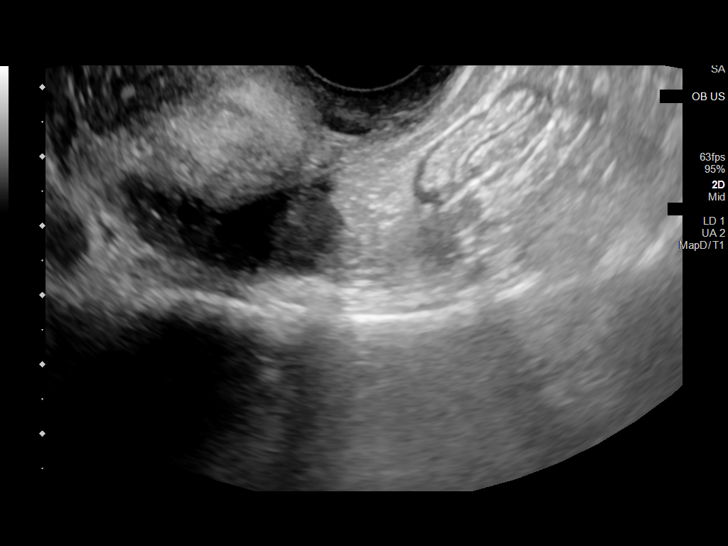
[im 54/77]
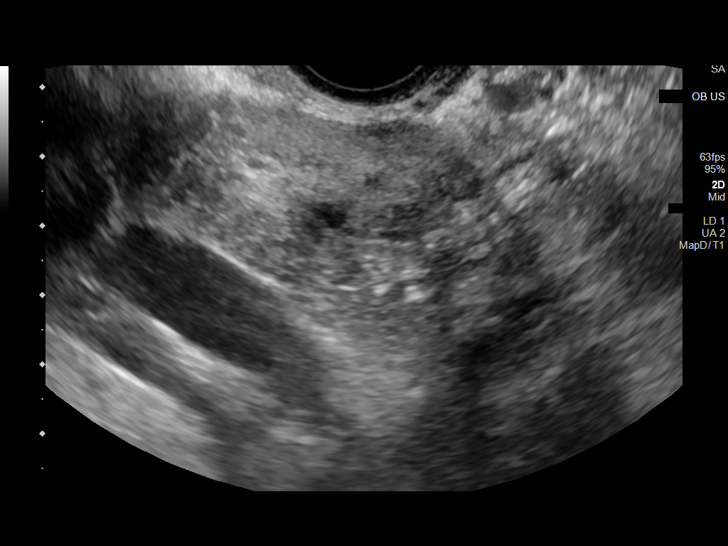
[im 60/77]
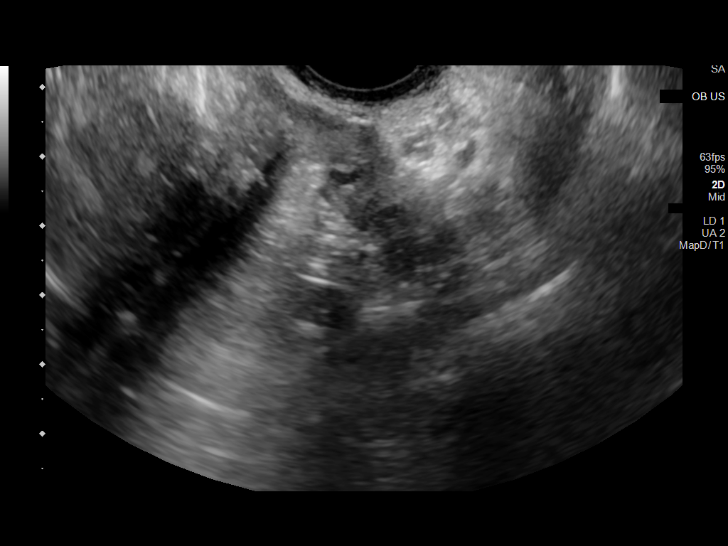
[im 65/77]
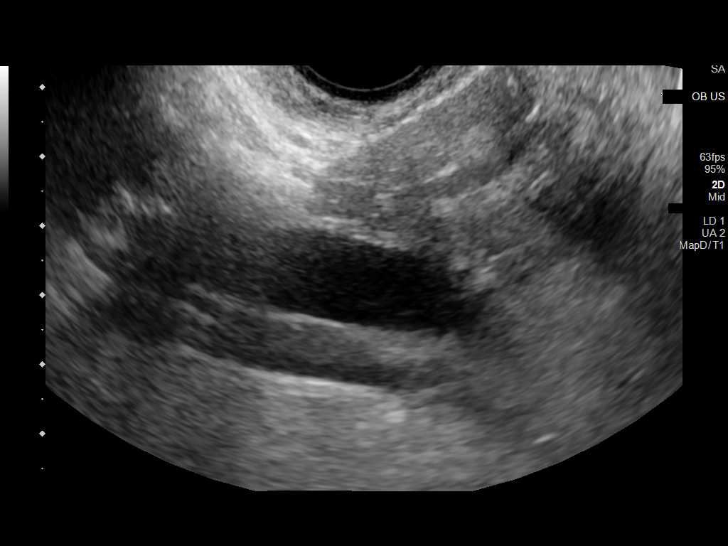
[im 71/77]
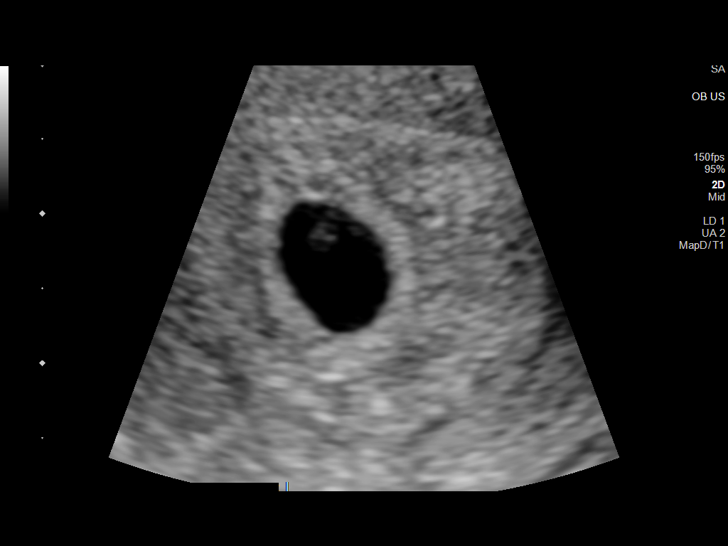
[im 77/77]
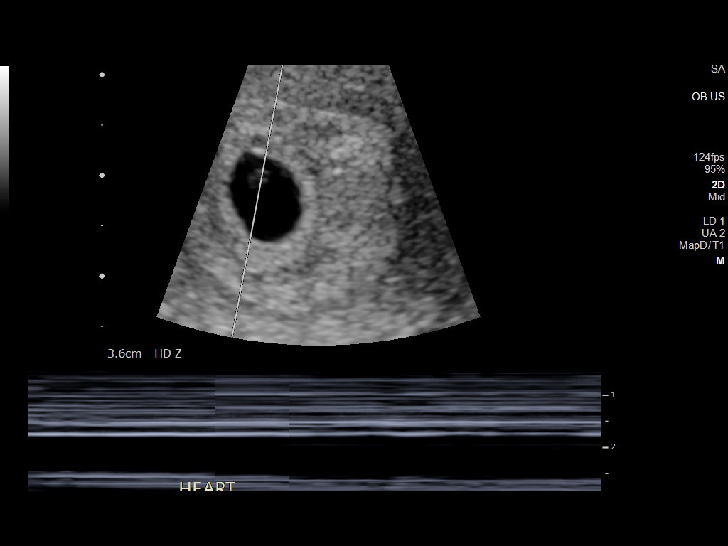

[14 of 28 positions shown; findings below may reference images not displayed]

FINDINGS: Intrauterine gestational sac: Single

Yolk sac:  Visualized.

Embryo:  Visualized.

Cardiac Activity: Not Visualized.

CRL:  2 mm   5 w   5 d                  US EDC: 04/17/2012

Subchorionic hemorrhage:  None visualized.

Maternal uterus/adnexae: Bilateral ovaries are within normal limits,
noting right corpus luteum.

Trace free fluid.
IMPRESSION: Single intrauterine gestation, measuring 5 weeks 5 days by
crown-rump length. No cardiac activity, possibly due to the small
size of the early gestation. Consider follow-up pelvic ultrasound in
7-10 days to confirm viability, as clinically warranted.
